# Patient Record
Sex: Male | Born: 1945 | Race: White | Hispanic: No | Marital: Single | Smoking: Current every day smoker
Health system: Southern US, Community
[De-identification: ages and names within clinical notes are randomized; demographics above are authoritative.]

## PROBLEM LIST (undated history)

## (undated) DIAGNOSIS — Z720 Tobacco use: Secondary | ICD-10-CM

## (undated) DIAGNOSIS — J449 Chronic obstructive pulmonary disease, unspecified: Secondary | ICD-10-CM

## (undated) DIAGNOSIS — K219 Gastro-esophageal reflux disease without esophagitis: Secondary | ICD-10-CM

## (undated) DIAGNOSIS — E785 Hyperlipidemia, unspecified: Secondary | ICD-10-CM

## (undated) DIAGNOSIS — N400 Enlarged prostate without lower urinary tract symptoms: Secondary | ICD-10-CM

## (undated) DIAGNOSIS — I4891 Unspecified atrial fibrillation: Secondary | ICD-10-CM

## (undated) DIAGNOSIS — I739 Peripheral vascular disease, unspecified: Secondary | ICD-10-CM

## (undated) DIAGNOSIS — G894 Chronic pain syndrome: Secondary | ICD-10-CM

## (undated) DIAGNOSIS — F4310 Post-traumatic stress disorder, unspecified: Secondary | ICD-10-CM

## (undated) DIAGNOSIS — Z89519 Acquired absence of unspecified leg below knee: Secondary | ICD-10-CM

## (undated) HISTORY — PX: BELOW KNEE LEG AMPUTATION: SUR23

## (undated) HISTORY — PX: OTHER SURGICAL HISTORY: SHX169

---

## 2011-10-15 ENCOUNTER — Other Ambulatory Visit: Payer: Self-pay

## 2011-10-15 ENCOUNTER — Emergency Department (HOSPITAL_COMMUNITY): Payer: Medicare Other

## 2011-10-15 ENCOUNTER — Encounter (HOSPITAL_COMMUNITY): Payer: Self-pay | Admitting: Emergency Medicine

## 2011-10-15 ENCOUNTER — Inpatient Hospital Stay (HOSPITAL_COMMUNITY)
Admission: EM | Admit: 2011-10-15 | Discharge: 2011-10-16 | DRG: 917 | Discharge status: Against Medical Advice | Payer: Medicare Other | Attending: Internal Medicine | Admitting: Internal Medicine

## 2011-10-15 DIAGNOSIS — K219 Gastro-esophageal reflux disease without esophagitis: Secondary | ICD-10-CM | POA: Diagnosis present

## 2011-10-15 DIAGNOSIS — G9340 Encephalopathy, unspecified: Secondary | ICD-10-CM | POA: Diagnosis present

## 2011-10-15 DIAGNOSIS — J189 Pneumonia, unspecified organism: Secondary | ICD-10-CM | POA: Diagnosis present

## 2011-10-15 DIAGNOSIS — I959 Hypotension, unspecified: Secondary | ICD-10-CM | POA: Diagnosis present

## 2011-10-15 DIAGNOSIS — R4182 Altered mental status, unspecified: Secondary | ICD-10-CM

## 2011-10-15 DIAGNOSIS — Z888 Allergy status to other drugs, medicaments and biological substances status: Secondary | ICD-10-CM

## 2011-10-15 DIAGNOSIS — F4312 Post-traumatic stress disorder, chronic: Secondary | ICD-10-CM | POA: Diagnosis present

## 2011-10-15 DIAGNOSIS — F19921 Other psychoactive substance use, unspecified with intoxication with delirium: Secondary | ICD-10-CM | POA: Diagnosis present

## 2011-10-15 DIAGNOSIS — F1320 Sedative, hypnotic or anxiolytic dependence, uncomplicated: Secondary | ICD-10-CM | POA: Diagnosis present

## 2011-10-15 DIAGNOSIS — I4891 Unspecified atrial fibrillation: Secondary | ICD-10-CM | POA: Diagnosis present

## 2011-10-15 DIAGNOSIS — Z886 Allergy status to analgesic agent status: Secondary | ICD-10-CM

## 2011-10-15 DIAGNOSIS — E876 Hypokalemia: Secondary | ICD-10-CM | POA: Diagnosis present

## 2011-10-15 DIAGNOSIS — E785 Hyperlipidemia, unspecified: Secondary | ICD-10-CM | POA: Diagnosis present

## 2011-10-15 DIAGNOSIS — R001 Bradycardia, unspecified: Secondary | ICD-10-CM | POA: Diagnosis present

## 2011-10-15 DIAGNOSIS — J438 Other emphysema: Secondary | ICD-10-CM | POA: Diagnosis present

## 2011-10-15 DIAGNOSIS — R031 Nonspecific low blood-pressure reading: Secondary | ICD-10-CM | POA: Diagnosis present

## 2011-10-15 DIAGNOSIS — Z720 Tobacco use: Secondary | ICD-10-CM

## 2011-10-15 DIAGNOSIS — Z79899 Other long term (current) drug therapy: Secondary | ICD-10-CM

## 2011-10-15 DIAGNOSIS — F1120 Opioid dependence, uncomplicated: Secondary | ICD-10-CM | POA: Diagnosis present

## 2011-10-15 DIAGNOSIS — G894 Chronic pain syndrome: Secondary | ICD-10-CM | POA: Diagnosis present

## 2011-10-15 DIAGNOSIS — F4310 Post-traumatic stress disorder, unspecified: Secondary | ICD-10-CM | POA: Diagnosis present

## 2011-10-15 DIAGNOSIS — T424X1A Poisoning by benzodiazepines, accidental (unintentional), initial encounter: Secondary | ICD-10-CM | POA: Diagnosis present

## 2011-10-15 DIAGNOSIS — T424X4A Poisoning by benzodiazepines, undetermined, initial encounter: Principal | ICD-10-CM | POA: Diagnosis present

## 2011-10-15 DIAGNOSIS — G4700 Insomnia, unspecified: Secondary | ICD-10-CM | POA: Diagnosis present

## 2011-10-15 DIAGNOSIS — I739 Peripheral vascular disease, unspecified: Secondary | ICD-10-CM | POA: Diagnosis present

## 2011-10-15 DIAGNOSIS — E871 Hypo-osmolality and hyponatremia: Secondary | ICD-10-CM | POA: Diagnosis present

## 2011-10-15 DIAGNOSIS — T40601A Poisoning by unspecified narcotics, accidental (unintentional), initial encounter: Secondary | ICD-10-CM

## 2011-10-15 DIAGNOSIS — T400X1A Poisoning by opium, accidental (unintentional), initial encounter: Secondary | ICD-10-CM | POA: Diagnosis present

## 2011-10-15 DIAGNOSIS — I498 Other specified cardiac arrhythmias: Secondary | ICD-10-CM | POA: Diagnosis present

## 2011-10-15 DIAGNOSIS — F4321 Adjustment disorder with depressed mood: Secondary | ICD-10-CM | POA: Diagnosis present

## 2011-10-15 DIAGNOSIS — F17200 Nicotine dependence, unspecified, uncomplicated: Secondary | ICD-10-CM | POA: Diagnosis present

## 2011-10-15 DIAGNOSIS — D649 Anemia, unspecified: Secondary | ICD-10-CM | POA: Diagnosis present

## 2011-10-15 DIAGNOSIS — R5383 Other fatigue: Secondary | ICD-10-CM | POA: Diagnosis present

## 2011-10-15 DIAGNOSIS — S88119A Complete traumatic amputation at level between knee and ankle, unspecified lower leg, initial encounter: Secondary | ICD-10-CM

## 2011-10-15 DIAGNOSIS — N400 Enlarged prostate without lower urinary tract symptoms: Secondary | ICD-10-CM | POA: Diagnosis present

## 2011-10-15 HISTORY — DX: Acquired absence of unspecified leg below knee: Z89.519

## 2011-10-15 HISTORY — DX: Chronic obstructive pulmonary disease, unspecified: J44.9

## 2011-10-15 HISTORY — DX: Chronic pain syndrome: G89.4

## 2011-10-15 HISTORY — DX: Tobacco use: Z72.0

## 2011-10-15 HISTORY — DX: Peripheral vascular disease, unspecified: I73.9

## 2011-10-15 HISTORY — DX: Hyperlipidemia, unspecified: E78.5

## 2011-10-15 HISTORY — DX: Unspecified atrial fibrillation: I48.91

## 2011-10-15 HISTORY — DX: Gastro-esophageal reflux disease without esophagitis: K21.9

## 2011-10-15 HISTORY — DX: Benign prostatic hyperplasia without lower urinary tract symptoms: N40.0

## 2011-10-15 HISTORY — DX: Post-traumatic stress disorder, unspecified: F43.10

## 2011-10-15 LAB — COMPREHENSIVE METABOLIC PANEL
AST: 22 U/L (ref 0–37)
Albumin: 3.1 g/dL — ABNORMAL LOW (ref 3.5–5.2)
Alkaline Phosphatase: 52 U/L (ref 39–117)
CO2: 31 mEq/L (ref 19–32)
Chloride: 96 mEq/L (ref 96–112)
GFR calc non Af Amer: >90 mL/min (ref >90)
Potassium: 3.4 mEq/L — ABNORMAL LOW (ref 3.5–5.1)
Total Bilirubin: 0.6 mg/dL (ref 0.3–1.2)

## 2011-10-15 LAB — ACETAMINOPHEN LEVEL: Acetaminophen (Tylenol), Serum: <15 ug/mL (ref 10–30)

## 2011-10-15 LAB — PROCALCITONIN: Procalcitonin: 0.21 ng/mL

## 2011-10-15 LAB — DIFFERENTIAL
Basophils Absolute: 0 10*3/uL (ref 0.0–0.1)
Basophils Relative: 0 % (ref 0–1)
Eosinophils Absolute: 0.3 10*3/uL (ref 0.0–0.7)
Eosinophils Relative: 3 % (ref 0–5)
Lymphs Abs: 2 10*3/uL (ref 0.7–4.0)

## 2011-10-15 LAB — URINALYSIS, ROUTINE W REFLEX MICROSCOPIC
Bilirubin Urine: NEGATIVE
Ketones, ur: NEGATIVE mg/dL
Nitrite: NEGATIVE
Protein, ur: NEGATIVE mg/dL
pH: 6 (ref 5.0–8.0)

## 2011-10-15 LAB — POCT I-STAT TROPONIN I: Troponin i, poc: 0 ng/mL (ref 0.00–0.08)

## 2011-10-15 LAB — LACTIC ACID, PLASMA: Lactic Acid, Venous: 1 mmol/L (ref 0.5–2.2)

## 2011-10-15 LAB — RAPID URINE DRUG SCREEN, HOSP PERFORMED
Amphetamines: NOT DETECTED
Opiates: POSITIVE — AB

## 2011-10-15 LAB — GLUCOSE, CAPILLARY: Glucose-Capillary: 130 mg/dL — ABNORMAL HIGH (ref 70–99)

## 2011-10-15 LAB — CBC
MCH: 32.3 pg (ref 26.0–34.0)
MCHC: 34.4 g/dL (ref 30.0–36.0)
MCV: 94 fL (ref 78.0–100.0)
Platelets: 200 10*3/uL (ref 150–400)
RDW: 13.9 % (ref 11.5–15.5)

## 2011-10-15 LAB — CARDIAC PANEL(CRET KIN+CKTOT+MB+TROPI): Relative Index: 1 (ref 0.0–2.5)

## 2011-10-15 MED ORDER — SODIUM CHLORIDE 0.9 % IV SOLN
INTRAVENOUS | Status: DC
  Administered 2011-10-15: 09:00:00 via INTRAVENOUS

## 2011-10-15 MED ORDER — TAMSULOSIN HCL 0.4 MG PO CAPS
0.4000 mg | ORAL_CAPSULE | Freq: Every day | ORAL | Status: DC
  Administered 2011-10-15: 0.4 mg via ORAL
  Filled 2011-10-15: qty 1

## 2011-10-15 MED ORDER — NALOXONE HCL 1 MG/ML IJ SOLN
2.0000 mg | Freq: Once | INTRAMUSCULAR | Status: AC
  Administered 2011-10-15: 2 mg via INTRAVENOUS

## 2011-10-15 MED ORDER — FINASTERIDE 5 MG PO TABS
5.0000 mg | ORAL_TABLET | Freq: Every day | ORAL | Status: DC
  Administered 2011-10-15 – 2011-10-16 (×2): 5 mg via ORAL
  Filled 2011-10-15 (×3): qty 1

## 2011-10-15 MED ORDER — MUPIROCIN 2 % EX OINT
1.0000 "application " | TOPICAL_OINTMENT | Freq: Two times a day (BID) | CUTANEOUS | Status: DC
  Administered 2011-10-15 – 2011-10-16 (×2): 1 via NASAL
  Filled 2011-10-15: qty 22

## 2011-10-15 MED ORDER — ACETAMINOPHEN 650 MG RE SUPP: 650.0000 mg | Freq: Four times a day (QID) | RECTAL | Status: DC | PRN

## 2011-10-15 MED ORDER — MOXIFLOXACIN HCL IN NACL 400 MG/250ML IV SOLN
400.0000 mg | Freq: Once | INTRAVENOUS | Status: AC
  Administered 2011-10-15: 400 mg via INTRAVENOUS
  Filled 2011-10-15: qty 250

## 2011-10-15 MED ORDER — NICOTINE 21 MG/24HR TD PT24
21.0000 mg | MEDICATED_PATCH | Freq: Every day | TRANSDERMAL | Status: DC
  Administered 2011-10-16: 21 mg via TRANSDERMAL
  Filled 2011-10-15: qty 1

## 2011-10-15 MED ORDER — NALOXONE HCL 0.4 MG/ML IJ SOLN
0.4000 mg | Freq: Once | INTRAMUSCULAR | Status: AC
  Administered 2011-10-15: 0.4 mg via INTRAVENOUS
  Filled 2011-10-15: qty 1

## 2011-10-15 MED ORDER — IOHEXOL 300 MG/ML  SOLN
80.0000 mL | Freq: Once | INTRAMUSCULAR | Status: AC | PRN
  Administered 2011-10-15: 80 mL via INTRAVENOUS

## 2011-10-15 MED ORDER — ONDANSETRON HCL 4 MG/2ML IJ SOLN: 4.0000 mg | Freq: Four times a day (QID) | INTRAMUSCULAR | Status: DC | PRN

## 2011-10-15 MED ORDER — MORPHINE SULFATE 2 MG/ML IJ SOLN
2.0000 mg | INTRAMUSCULAR | Status: DC | PRN
  Administered 2011-10-16: 2 mg via INTRAVENOUS
  Filled 2011-10-15: qty 1

## 2011-10-15 MED ORDER — THIAMINE HCL 100 MG/ML IJ SOLN
100.0000 mg | Freq: Every day | INTRAMUSCULAR | Status: AC
  Administered 2011-10-15: 100 mg via INTRAVENOUS
  Filled 2011-10-15: qty 2

## 2011-10-15 MED ORDER — DOCUSATE SODIUM 100 MG PO CAPS
100.0000 mg | ORAL_CAPSULE | Freq: Two times a day (BID) | ORAL | Status: DC
  Administered 2011-10-15 – 2011-10-16 (×3): 100 mg via ORAL
  Filled 2011-10-15 (×3): qty 1

## 2011-10-15 MED ORDER — CLOPIDOGREL BISULFATE 75 MG PO TABS
75.0000 mg | ORAL_TABLET | Freq: Every day | ORAL | Status: DC
  Administered 2011-10-15 – 2011-10-16 (×2): 75 mg via ORAL
  Filled 2011-10-15 (×2): qty 1

## 2011-10-15 MED ORDER — PANTOPRAZOLE SODIUM 40 MG PO TBEC
40.0000 mg | DELAYED_RELEASE_TABLET | Freq: Every day | ORAL | Status: DC
  Administered 2011-10-15 – 2011-10-16 (×2): 40 mg via ORAL
  Filled 2011-10-15 (×2): qty 1

## 2011-10-15 MED ORDER — NALOXONE HCL 0.4 MG/ML IJ SOLN
2.0000 ug/kg/h | INTRAVENOUS | Status: DC
  Filled 2011-10-15: qty 2.5

## 2011-10-15 MED ORDER — KCL IN DEXTROSE-NACL 40-5-0.9 MEQ/L-%-% IV SOLN
INTRAVENOUS | Status: DC
  Administered 2011-10-15: 15:00:00 via INTRAVENOUS
  Administered 2011-10-16: 100 mL via INTRAVENOUS
  Filled 2011-10-15 (×4): qty 1000

## 2011-10-15 MED ORDER — MOXIFLOXACIN HCL IN NACL 400 MG/250ML IV SOLN
400.0000 mg | INTRAVENOUS | Status: DC
  Administered 2011-10-16: 400 mg via INTRAVENOUS
  Filled 2011-10-15 (×2): qty 250

## 2011-10-15 MED ORDER — ALPRAZOLAM 0.25 MG PO TABS: 0.2500 mg | ORAL_TABLET | Freq: Every evening | ORAL | Status: DC | PRN

## 2011-10-15 MED ORDER — MOXIFLOXACIN HCL IN NACL 400 MG/250ML IV SOLN
400.0000 mg | INTRAVENOUS | Status: DC
  Filled 2011-10-15 (×2): qty 250

## 2011-10-15 MED ORDER — NALOXONE HCL 1 MG/ML IJ SOLN
INTRAMUSCULAR | Status: AC
  Filled 2011-10-15: qty 2

## 2011-10-15 MED ORDER — SODIUM CHLORIDE 0.9 % IV SOLN
Freq: Once | INTRAVENOUS | Status: AC
  Administered 2011-10-15: 20 mL/h via INTRAVENOUS

## 2011-10-15 MED ORDER — ONDANSETRON HCL 4 MG PO TABS: 4.0000 mg | ORAL_TABLET | Freq: Four times a day (QID) | ORAL | Status: DC | PRN

## 2011-10-15 MED ORDER — ALUM & MAG HYDROXIDE-SIMETH 200-200-20 MG/5ML PO SUSP: 30.0000 mL | Freq: Four times a day (QID) | ORAL | Status: DC | PRN

## 2011-10-15 MED ORDER — ACETAMINOPHEN 325 MG PO TABS: 650.0000 mg | ORAL_TABLET | Freq: Four times a day (QID) | ORAL | Status: DC | PRN

## 2011-10-15 MED ORDER — CHLORHEXIDINE GLUCONATE CLOTH 2 % EX PADS
6.0000 | MEDICATED_PAD | Freq: Every day | CUTANEOUS | Status: DC
  Administered 2011-10-16: 6 via TOPICAL

## 2011-10-15 MED ORDER — SODIUM CHLORIDE 0.9 % IV BOLUS (SEPSIS)
500.0000 mL | Freq: Once | INTRAVENOUS | Status: AC
  Administered 2011-10-15: 500 mL via INTRAVENOUS

## 2011-10-15 NOTE — ED Notes (Signed)
Pt cleaned of incontinent urine X2

## 2011-10-15 NOTE — ED Notes (Signed)
Pt reports taking a xanax last night to help him sleep. Pt states he hasn't slept in a long time.

## 2011-10-15 NOTE — ED Provider Notes (Signed)
History   This chart was scribed for Laray Anger, DO by Clarita Crane. The patient was seen in room APA18/[facility]18 and the patient's care was started at 7:59AM.   CSN: [id number]  Arrival date & time 10/15/11  0716   First MD Initiated Contact with Patient 10/15/11 0750      Chief Complaint  Patient presents with  . Fatigue  . Fall    The history is provided by the patient and the EMS personnel. History Limited By: lethargic, poor historian.   Patient seen at 7:59AM Curtis Shea is a 66 y.o. male seen at 7:59 AM who presents to the Emergency Department after being brought in by EMS for evaluation following 2 falls this morning.  Pt states he rolled out of bed and fell to the floor both times. Patient also notes experiencing generalized weakness/fatigue this morning but reports taking a Valium and "some of my oxycodone" last night to help him sleep because he has been unable to sleep for the past several days. States he used his "[employer]" to call for EMS.     PCP- None Psychiatrist- in [place], [place] Past Medical History  Diagnosis Date  . COPD (chronic obstructive pulmonary disease)   . PTSD (post-traumatic stress disorder)   . Hx of BKA     Past Surgical History  Procedure Date  . Below knee leg amputation     History  Substance Use Topics  . Smoking status: Current Everyday Smoker -- 1.0 packs/day  . Smokeless tobacco: Not on file  . Alcohol Use: No  -Lives alone    Review of Systems  Unable to perform ROS: Other    Allergies  Aspirin; Mellaril; and Tegretol  Home Medications  No current outpatient prescriptions on file.  BP 93/43  Pulse 58  Temp(Src) 98.8 F (37.1 C) (Oral)  Resp 20  Ht 5\' 8"  (1.727 m)  Wt 135 lb (61.236 kg)  BMI 20.53 kg/m2  SpO2 94%  Physical Exam Exam performed at 8:01AM Physical examination:  Nursing notes reviewed; Vital signs and O2 SAT reviewed;  Constitutional: Thin, disheveled, In no acute distress; Head:  Normocephalic,  atraumatic; Eyes: EOMI, PERRL, No scleral icterus; ENMT: Mouth and pharynx normal, Mucous membranes dry; Neck: Supple, Full range of motion, No lymphadenopathy; Cardiovascular: Regular rate and rhythm, No gallop; Respiratory: Breath sounds clear & equal bilaterally, No wheezes, Normal respiratory effort/excursion; Chest: Nontender, Movement normal; Abdomen: Soft, Nontender, Nondistended, Normal bowel sounds; Extremities: Pulses normal, No deformity, no tenderness, No edema, +left BKA; Neuro: Lethargic, awakens to name, no facial droop, speech clear. Major CN grossly intact. Moves all ext without apparent focal motor deficits; Skin: Color normal, Warm, Dry, no rash.    ED Course  Procedures   0815:  Pt endorses to me that he has taken "some valium" to sleep, as well as "maybe I took some of my oxycodone."  Will give IVF and IV narcan.    0900:  More awake after narcan.  SBP now 100's from 60's, improved.  Sats remain high 90's on R/A.  Told ED staff that his "son died last week."  While undressing pt, ED staff found a bottle of misc pills.  Will send to pharmacy to ascertain what they are.   0930:  Pharm gave ED RN list of names of multiple pills in the bottle, which includes xanax, valium, oxycodone, oxycodone IR, morphine, morphine IR.  Will give another dose of narcan IV and start narcan gtt, given good effect with 1st  narcan dose as well as concern regarding long acting narcotic formulations pt may have taken.  Will not give romazicon due to high likelihood of pt using benzos long term, causing withdrawal seizure.  Will add ASA and APAP levels.  CT chest with left pneumonia.  Will start IV avelox.  Doubt sepsis at this time, as lactic acid and procalcitonin both normal.  Pt maintaining his own airway, Sats continue high 90's.   1010:  T/C to [facility] Dr. Sherrie Mustache, case discussed, including:  HPI, pertinent PM/SHx, VS/PE, dx testing, ED course and treatment.  Agreeable to admit.  Requests to write temporary  orders, ICU bed.      MDM  MDM Reviewed: nursing note and vitals Interpretation: ECG, labs, x-ray and CT scan Total time providing critical care: 75-105 minutes. This excludes time spent performing separately reportable procedures and services.   CRITICAL CARE Performed by: Laray Anger Total critical care time: 90 Critical care time was exclusive of separately billable procedures and treating other patients. Critical care was necessary to treat or prevent imminent or life-threatening deterioration. Critical care was time spent personally by me on the following activities: development of treatment plan with patient and/or surrogate as well as nursing, discussions with consultants, evaluation of patient's response to treatment, examination of patient, obtaining history from patient or surrogate, ordering and performing treatments and interventions, ordering and review of laboratory studies, ordering and review of radiographic studies, pulse oximetry and re-evaluation of patient's condition.    Date: 10/15/2011  Rate: 57  Rhythm: normal sinus rhythm  QRS Axis: left  Intervals: normal  ST/T Wave abnormalities: normal  Conduction Disutrbances:none  Narrative Interpretation:   Old EKG Reviewed: none available.  Results for orders placed during the hospital encounter of 10/15/11  COMPREHENSIVE METABOLIC PANEL      Component Value Range   Sodium 134 (*) 135 - 145 (mEq/L)   Potassium 3.4 (*) 3.5 - 5.1 (mEq/L)   Chloride 96  96 - 112 (mEq/L)   CO2 31  19 - 32 (mEq/L)   Glucose, Bld 121 (*) 70 - 99 (mg/dL)   BUN 6  6 - 23 (mg/dL)   Creatinine, Ser 1.61  0.50 - 1.35 (mg/dL)   Calcium 9.2  8.4 - 09.6 (mg/dL)   Total Protein 6.2  6.0 - 8.3 (g/dL)   Albumin 3.1 (*) 3.5 - 5.2 (g/dL)   AST 22  0 - 37 (U/L)   ALT 13  0 - 53 (U/L)   Alkaline Phosphatase 52  39 - 117 (U/L)   Total Bilirubin 0.6  0.3 - 1.2 (mg/dL)   GFR calc non Af Amer >90  >90 (mL/min)   GFR calc Af Amer >90  >90  (mL/min)  CBC      Component Value Range   WBC 9.1  4.0 - 10.5 (K/uL)   RBC 3.68 (*) 4.22 - 5.81 (MIL/uL)   Hemoglobin 11.9 (*) 13.0 - 17.0 (g/dL)   HCT 04.5 (*) 40.9 - 52.0 (%)   MCV 94.0  78.0 - 100.0 (fL)   MCH 32.3  26.0 - 34.0 (pg)   MCHC 34.4  30.0 - 36.0 (g/dL)   RDW 81.1  91.4 - 78.2 (%)   Platelets 200  150 - 400 (K/uL)  DIFFERENTIAL      Component Value Range   Neutrophils Relative 69  43 - 77 (%)   Neutro Abs 6.2  1.7 - 7.7 (K/uL)   Lymphocytes Relative 22  12 - 46 (%)   Lymphs  Abs 2.0  0.7 - 4.0 (K/uL)   Monocytes Relative 6  3 - 12 (%)   Monocytes Absolute 0.5  0.1 - 1.0 (K/uL)   Eosinophils Relative 3  0 - 5 (%)   Eosinophils Absolute 0.3  0.0 - 0.7 (K/uL)   Basophils Relative 0  0 - 1 (%)   Basophils Absolute 0.0  0.0 - 0.1 (K/uL)  LACTIC ACID, PLASMA      Component Value Range   Lactic Acid, Venous 1.0  0.5 - 2.2 (mmol/L)  URINALYSIS, ROUTINE W REFLEX MICROSCOPIC      Component Value Range   Color, Urine YELLOW  YELLOW    APPearance CLEAR  CLEAR    Specific Gravity, Urine 1.005  1.005 - 1.030    pH 6.0  5.0 - 8.0    Glucose, UA NEGATIVE  NEGATIVE (mg/dL)   Hgb urine dipstick NEGATIVE  NEGATIVE    Bilirubin Urine NEGATIVE  NEGATIVE    Ketones, ur NEGATIVE  NEGATIVE (mg/dL)   Protein, ur NEGATIVE  NEGATIVE (mg/dL)   Urobilinogen, UA 0.2  0.0 - 1.0 (mg/dL)   Nitrite NEGATIVE  NEGATIVE    Leukocytes, UA NEGATIVE  NEGATIVE   URINE RAPID DRUG SCREEN (HOSP PERFORMED)      Component Value Range   Opiates POSITIVE (*) NONE DETECTED    Cocaine NONE DETECTED  NONE DETECTED    Benzodiazepines POSITIVE (*) NONE DETECTED    Amphetamines NONE DETECTED  NONE DETECTED    Tetrahydrocannabinol NONE DETECTED  NONE DETECTED    Barbiturates NONE DETECTED  NONE DETECTED   ETHANOL      Component Value Range   Alcohol, Ethyl (B) <11  0 - 11 (mg/dL)  PROCALCITONIN      Component Value Range   Procalcitonin 0.21    POCT I-STAT TROPONIN I      Component Value Range    Troponin i, poc 0.00  0.00 - 0.08 (ng/mL)   Comment 3           GLUCOSE, CAPILLARY      Component Value Range   Glucose-Capillary 130 (*) 70 - 99 (mg/dL)   Ct Head Wo Contrast 10/15/2011  *RADIOLOGY REPORT*  Clinical Data: Fall, unresponsive  CT HEAD WITHOUT CONTRAST  Technique:  Contiguous axial images were obtained from the base of the skull through the vertex without contrast.  Comparison: None.  Findings: The patient's head was tilted in the scanner on initial scan.  The scan was repeated with improvement.  No acute intracranial hemorrhage.  No focal mass lesion.  No CT evidence of acute infarction.   No midline shift or mass effect. No hydrocephalus.  Basilar cisterns are patent.  There is mild generalized cortical atrophy.  Mastoid air cells are clear.  There is mild mucosal thickening within the maxillary and sphenoid and frontal sinuses inferiorly.  IMPRESSION:  1.  No acute intracranial findings. 2.  Mild atrophy. 3.  Mild sinus inflammation.  Original Report Authenticated By: Genevive Bi, M.D.   Ct Chest W Contrast 10/15/2011  *RADIOLOGY REPORT*  Clinical Data: Pneumonia on chest radiograph., short of breath  CT CHEST WITH CONTRAST  Technique:  Multidetector CT imaging of the chest was performed following the standard protocol during bolus administration of intravenous contrast.  Contrast: 80mL OMNIPAQUE IOHEXOL 300 MG/ML IV SOLN  Comparison: Chest radiograph 10/15/2011  Findings: There is patchy nodular air space disease within the left lower lobe most consistent with pneumonia.  There is mild central lobular emphysema  at the lung apices.  Central airways are normal.  No axillary supraclavicular lymphadenopathy.  Mildly enlarge subcarinal lymph node measures 12 mm short axis.  No pericardial fluid.  Coronary artery disease noted.  Limited view of the upper abdomen shows normal adrenal glands. There is a surgical line in the gastric cardiac region.  Limited view of the skeleton demonstrates  posterior healed rib fractures versus thoracotomy defects in the left posterior ribs. There is compression deformity of the mid thoracic vertebral body. There are to large a Schmorl's nodes within this vertebral body.  IMPRESSION:  1.  Left lower lobe pneumonia.  Recommend follow-up CT  to insure resolution after appropriate therapy. The patient is a heavy smoker.  2.  Enlarge subcarinal lymph nodes likely reactive.  Recommend attention on follow-up CT. 3.  Central lobular emphysema. 4.  Mild chronic compression deformity of the mid lumbar vertebral body with large endplate Schmorl's nodes.  Original Report Authenticated By: Genevive Bi, M.D.   Dg Chest Port 1 View 10/15/2011  *RADIOLOGY REPORT*  Clinical Data: Shortness of breath, weakness  PORTABLE CHEST - 1 VIEW  Comparison: None.  Findings: There is opacity of both lung bases left greater than right most consistent with pneumonia.  The lungs are slightly hyperaerated.  No effusion is seen.  The heart is mildly enlarged. A cavitary lesion cannot be excluded overlying the left heart border at the medial left lung base.  Follow-up two-view chest x- ray and possible CT the chest is recommended.  IMPRESSION:  1.  Bibasilar lung opacities most consistent with pneumonia. 2.  Cannot exclude a cavitary lesion medially at the left lung base.  Consider CT of the chest with IV contrast to assess further.  Original Report Authenticated By: Juline Patch, M.D.          I personally performed the services described in this documentation, which was scribed in my presence. The recorded information has been reviewed and considered.  Vonya Ohalloran Allison Quarry, DO 10/16/11 1510

## 2011-10-15 NOTE — H&P (Signed)
Curtis Shea MRN: [id number] DOB/AGE: 1946/04/17 66 y.o. Primary Care Physician: [facility] [place]. Admit date: 10/15/2011 Chief Complaint: The patient fell out of bed twice. HPI: The patient is a 66 year old man apparently new to the [facility] health system, who presents to the emergency department this morning after falling out of his bed twice. The patient is lethargic and is able to provide some history. Therefore, the history is being provided by the patient and from the ED staff. Accordingly, the patient had been having difficulty sleeping because his son died unexpectedly last week. He does not outright admit to taking more of his chronic medications for pain and anxiety as prescribed. He denies suicidal ideation or any attempts to take his life or hurt himself. Apparently, he fell out of his bed twice this morning. He does not recall the circumstances surrounding the falls. He was able to brace himself to get back up to his bed. He was able to push his lifeline button. Subsequently, EMS was called. The patient denies any recent fever, chills, chest pain, shortness of breath, abdominal pain, nausea, vomiting, or diarrhea. He has acknowledges a wet nonproductive cough. On arrival to the emergency department, his pulse rate was 48 and his blood pressure was 69/41. He was afebrile. Currently, his blood pressure is in the 100s systolically and his pulse rate is 53. The CT scan of his head revealed no acute intracranial findings. His chest x-ray reveals bibasilar opacities most consistent with pneumonia. The CT scan of his chest reveals left lower lobe pneumonia, emphysema, and enlarged lymph nodes likely reactive. His urine drug screen is positive for benzodiazepines and opiates. His lab data are significant for a serum sodium of 134, serum potassium 3.4, glucose of 121, hemoglobin 11.9, and a troponin I of 0. He is being admitted for further evaluation and management.   Past Medical History  Diagnosis  Date  . COPD (chronic obstructive pulmonary disease)   . PTSD (post-traumatic stress disorder)   . Hx of BKA   . GERD (gastroesophageal reflux disease)   . Peripheral vascular disease   . Atrial fibrillation   . Chronic pain syndrome 10/15/2011  . Tobacco abuse 10/15/2011  . Hyperlipidemia   . BPH (benign prostatic hyperplasia)     Past Surgical History  Procedure Date  . Below knee leg amputation     Left leg.  . Multiple surgeries left foot     Prior to Admission medications   Medication Sig Start Date End Date Taking? Authorizing Provider  albuterol (PROVENTIL HFA;VENTOLIN HFA) 108 (90 BASE) MCG/ACT inhaler Inhale 2 puffs into the lungs every 6 (six) hours as needed. Shortness of breath   Yes Historical Provider, MD  ALPRAZolam Prudy Feeler) 1 MG tablet Take 1 mg by mouth at bedtime as needed. anxiety   Yes Historical Provider, MD  calcium carbonate (OS-CAL) 600 MG TABS Take 600 mg by mouth 2 (two) times daily with a meal.   Yes Historical Provider, MD  cholecalciferol (VITAMIN D) 400 UNITS TABS Take 400 Units by mouth daily.   Yes Historical Provider, MD  clopidogrel (PLAVIX) 75 MG tablet Take 75 mg by mouth daily.   Yes Historical Provider, MD  finasteride (PROSCAR) 5 MG tablet Take 5 mg by mouth daily.   Yes Historical Provider, MD  morphine (MS CONTIN) 100 MG 12 hr tablet Take 100 mg by mouth every 8 (eight) hours. Morphine sulfate ER 100 mg tablets   Yes Historical Provider, MD  morphine (MSIR) 30 MG  tablet Take 30 mg by mouth every 6 (six) hours as needed. pain   Yes Historical Provider, MD  omeprazole (PRILOSEC) 20 MG capsule Take 20 mg by mouth daily.   Yes Historical Provider, MD  oxycodone (OXY-IR) 5 MG capsule Take 10 mg by mouth every 6 (six) hours as needed. Use only if needed for pain control   Yes Historical Provider, MD  simvastatin (ZOCOR) 80 MG tablet Take 80 mg by mouth at bedtime.   Yes Historical Provider, MD  Tamsulosin HCl (FLOMAX) 0.4 MG CAPS Take 0.4 mg by mouth.    Yes Historical Provider, MD    Allergies:  Allergies  Allergen Reactions  . Aspirin   . Mellaril (Thioridazine Hcl)   . Tegretol (Carbamazepine)     History reviewed. No pertinent family history.  Social History: the patient is divorced. He lives in [place]. His son had lived with him, however, he died unexpectedly last week. He was 68 years old. The patient is a Cytogeneticist. He smokes one pack of cigarettes per day and has been doing so for over 40 years. He denies alcohol and illicit drug use.    ROS: Positive for chronic pain in both of his lower extremities. Otherwise, review of systems is negative.   PHYSICAL EXAM: Blood pressure 108/67, pulse 53, temperature 97.8 F (36.6 C), temperature source Oral, resp. rate 23, height 5\' 8"  (1.727 m), weight 59 kg (130 lb 1.1 oz), SpO2 100.00%.  General: Thin 66 year old Caucasian man who is asleep and mildly difficult to arouse. HEENT: Head is normocephalic, nontraumatic. Pupils equal, round, and reactive to light. Extraocular movements are intact. Conjunctivae are clear. Sclerae are white. Nasal mucosa is dry. Oropharynx reveals no teeth. His membranes are very dry. No posterior exudates or or erythema. Neck: Supple, no adenopathy, no thyromegaly, no JVD. Lungs: Occasional wheezes and crackles faintly. Breathing is nonlabored. Heart: S1, S2, with bradycardia. Abdomen: Positive bowel sounds, soft, nontender, nondistended. Extremities: Left BKA stump in place with no abrasions. Right lower extremity with palpable pedal pulses. One small excoriation on the medial aspect of the leg, non-bleeding. No pedal edema. Neurologic: He is lethargic. He answers with short answers. He falls back to sleep. He has overall lethargy with some dysarthria when speaking. All of his cranial nerves appear to be intact otherwise. He is able to move his extremities with and without provocation.   Basic Metabolic Panel:  Basename 10/15/11 0811  NA 134*  K 3.4*    CL 96  CO2 31  GLUCOSE 121*  BUN 6  CREATININE 0.60  CALCIUM 9.2  MG --  PHOS --   Liver Function Tests:  Basename 10/15/11 0811  AST 22  ALT 13  ALKPHOS 52  BILITOT 0.6  PROT 6.2  ALBUMIN 3.1*   No results found for this basename: LIPASE:2,AMYLASE:2 in the last 72 hours No results found for this basename: AMMONIA:2 in the last 72 hours CBC:  Basename 10/15/11 0811  WBC 9.1  NEUTROABS 6.2  HGB 11.9*  HCT 34.6*  MCV 94.0  PLT 200   Cardiac Enzymes: No results found for this basename: CKTOTAL:3,CKMB:3,CKMBINDEX:3,TROPONINI:3 in the last 72 hours BNP: No results found for this basename: PROBNP:3 in the last 72 hours D-Dimer: No results found for this basename: DDIMER:2 in the last 72 hours CBG:  Basename 10/15/11 0715  GLUCAP 130*   Hemoglobin A1C: No results found for this basename: HGBA1C in the last 72 hours Fasting Lipid Panel: No results found for this basename: CHOL,HDL,LDLCALC,TRIG,CHOLHDL,LDLDIRECT  in the last 72 hours Thyroid Function Tests: No results found for this basename: TSH,T4TOTAL,FREET4,T3FREE,THYROIDAB in the last 72 hours Anemia Panel: No results found for this basename: VITAMINB12,FOLATE,FERRITIN,TIBC,IRON,RETICCTPCT in the last 72 hours Coagulation: No results found for this basename: LABPROT:2,INR:2 in the last 72 hours Urine Drug Screen: Drugs of Abuse     Component Value Date/Time   LABOPIA POSITIVE* 10/15/2011 0841   COCAINSCRNUR NONE DETECTED 10/15/2011 0841   LABBENZ POSITIVE* 10/15/2011 0841   AMPHETMU NONE DETECTED 10/15/2011 0841   THCU NONE DETECTED 10/15/2011 0841   LABBARB NONE DETECTED 10/15/2011 0841    Alcohol Level:  Basename 10/15/11 0811  ETH <11   Urinalysis:  Basename 10/15/11 0841  COLORURINE YELLOW  LABSPEC 1.005  PHURINE 6.0  GLUCOSEU NEGATIVE  HGBUR NEGATIVE  BILIRUBINUR NEGATIVE  KETONESUR NEGATIVE  PROTEINUR NEGATIVE  UROBILINOGEN 0.2  NITRITE NEGATIVE  LEUKOCYTESUR NEGATIVE   Misc. Labs:   EKG:  Sinus bradycardia with a heart rate of 57 beats per minute.   No results found for this or any previous visit (from the past 240 hour(s)).   Results for orders placed during the hospital encounter of 10/15/11 (from the past 48 hour(s))  GLUCOSE, CAPILLARY     Status: Abnormal   Collection Time   10/15/11  7:15 AM      Component Value Range Comment   Glucose-Capillary 130 (*) 70 - 99 (mg/dL)   COMPREHENSIVE METABOLIC PANEL     Status: Abnormal   Collection Time   10/15/11  8:11 AM      Component Value Range Comment   Sodium 134 (*) 135 - 145 (mEq/L)    Potassium 3.4 (*) 3.5 - 5.1 (mEq/L)    Chloride 96  96 - 112 (mEq/L)    CO2 31  19 - 32 (mEq/L)    Glucose, Bld 121 (*) 70 - 99 (mg/dL)    BUN 6  6 - 23 (mg/dL)    Creatinine, Ser 4.09  0.50 - 1.35 (mg/dL)    Calcium 9.2  8.4 - 10.5 (mg/dL)    Total Protein 6.2  6.0 - 8.3 (g/dL)    Albumin 3.1 (*) 3.5 - 5.2 (g/dL)    AST 22  0 - 37 (U/L)    ALT 13  0 - 53 (U/L)    Alkaline Phosphatase 52  39 - 117 (U/L)    Total Bilirubin 0.6  0.3 - 1.2 (mg/dL)    GFR calc non Af Amer >90  >90 (mL/min)    GFR calc Af Amer >90  >90 (mL/min)   CBC     Status: Abnormal   Collection Time   10/15/11  8:11 AM      Component Value Range Comment   WBC 9.1  4.0 - 10.5 (K/uL)    RBC 3.68 (*) 4.22 - 5.81 (MIL/uL)    Hemoglobin 11.9 (*) 13.0 - 17.0 (g/dL)    HCT 81.1 (*) 91.4 - 52.0 (%)    MCV 94.0  78.0 - 100.0 (fL)    MCH 32.3  26.0 - 34.0 (pg)    MCHC 34.4  30.0 - 36.0 (g/dL)    RDW 78.2  95.6 - 21.3 (%)    Platelets 200  150 - 400 (K/uL)   DIFFERENTIAL     Status: Normal   Collection Time   10/15/11  8:11 AM      Component Value Range Comment   Neutrophils Relative 69  43 - 77 (%)    Neutro Abs 6.2  1.7 - 7.7 (K/uL)    Lymphocytes Relative 22  12 - 46 (%)    Lymphs Abs 2.0  0.7 - 4.0 (K/uL)    Monocytes Relative 6  3 - 12 (%)    Monocytes Absolute 0.5  0.1 - 1.0 (K/uL)    Eosinophils Relative 3  0 - 5 (%)    Eosinophils Absolute 0.3  0.0 - 0.7  (K/uL)    Basophils Relative 0  0 - 1 (%)    Basophils Absolute 0.0  0.0 - 0.1 (K/uL)   ETHANOL     Status: Normal   Collection Time   10/15/11  8:11 AM      Component Value Range Comment   Alcohol, Ethyl (B) <11  0 - 11 (mg/dL)   URINALYSIS, ROUTINE W REFLEX MICROSCOPIC     Status: Normal   Collection Time   10/15/11  8:41 AM      Component Value Range Comment   Color, Urine YELLOW  YELLOW     APPearance CLEAR  CLEAR     Specific Gravity, Urine 1.005  1.005 - 1.030     pH 6.0  5.0 - 8.0     Glucose, UA NEGATIVE  NEGATIVE (mg/dL)    Hgb urine dipstick NEGATIVE  NEGATIVE     Bilirubin Urine NEGATIVE  NEGATIVE     Ketones, ur NEGATIVE  NEGATIVE (mg/dL)    Protein, ur NEGATIVE  NEGATIVE (mg/dL)    Urobilinogen, UA 0.2  0.0 - 1.0 (mg/dL)    Nitrite NEGATIVE  NEGATIVE     Leukocytes, UA NEGATIVE  NEGATIVE  MICROSCOPIC NOT DONE ON URINES WITH NEGATIVE PROTEIN, BLOOD, LEUKOCYTES, NITRITE, OR GLUCOSE <1000 mg/dL.  URINE RAPID DRUG SCREEN (HOSP PERFORMED)     Status: Abnormal   Collection Time   10/15/11  8:41 AM      Component Value Range Comment   Opiates POSITIVE (*) NONE DETECTED     Cocaine NONE DETECTED  NONE DETECTED     Benzodiazepines POSITIVE (*) NONE DETECTED     Amphetamines NONE DETECTED  NONE DETECTED     Tetrahydrocannabinol NONE DETECTED  NONE DETECTED     Barbiturates NONE DETECTED  NONE DETECTED    LACTIC ACID, PLASMA     Status: Normal   Collection Time   10/15/11  8:46 AM      Component Value Range Comment   Lactic Acid, Venous 1.0  0.5 - 2.2 (mmol/L)   PROCALCITONIN     Status: Normal   Collection Time   10/15/11  8:46 AM      Component Value Range Comment   Procalcitonin 0.21     POCT I-STAT TROPONIN I     Status: Normal   Collection Time   10/15/11  8:55 AM      Component Value Range Comment   Troponin i, poc 0.00  0.00 - 0.08 (ng/mL)    Comment 3            ACETAMINOPHEN LEVEL     Status: Normal   Collection Time   10/15/11  9:49 AM      Component Value Range  Comment   Acetaminophen (Tylenol), Serum <15.0  10 - 30 (ug/mL)   SALICYLATE LEVEL     Status: Abnormal   Collection Time   10/15/11  9:49 AM      Component Value Range Comment   Salicylate Lvl <2.0 (*) 2.8 - 20.0 (mg/dL)     Ct Head Wo Contrast  10/15/2011  *RADIOLOGY REPORT*  Clinical Data: Fall, unresponsive  CT HEAD WITHOUT CONTRAST  Technique:  Contiguous axial images were obtained from the base of the skull through the vertex without contrast.  Comparison: None.  Findings: The patient's head was tilted in the scanner on initial scan.  The scan was repeated with improvement.  No acute intracranial hemorrhage.  No focal mass lesion.  No CT evidence of acute infarction.   No midline shift or mass effect. No hydrocephalus.  Basilar cisterns are patent.  There is mild generalized cortical atrophy.  Mastoid air cells are clear.  There is mild mucosal thickening within the maxillary and sphenoid and frontal sinuses inferiorly.  IMPRESSION:  1.  No acute intracranial findings. 2.  Mild atrophy. 3.  Mild sinus inflammation.  Original Report Authenticated By: Genevive Bi, M.D.   Ct Chest W Contrast  10/15/2011  *RADIOLOGY REPORT*  Clinical Data: Pneumonia on chest radiograph., short of breath  CT CHEST WITH CONTRAST  Technique:  Multidetector CT imaging of the chest was performed following the standard protocol during bolus administration of intravenous contrast.  Contrast: 80mL OMNIPAQUE IOHEXOL 300 MG/ML IV SOLN  Comparison: Chest radiograph 10/15/2011  Findings: There is patchy nodular air space disease within the left lower lobe most consistent with pneumonia.  There is mild central lobular emphysema at the lung apices.  Central airways are normal.  No axillary supraclavicular lymphadenopathy.  Mildly enlarge subcarinal lymph node measures 12 mm short axis.  No pericardial fluid.  Coronary artery disease noted.  Limited view of the upper abdomen shows normal adrenal glands. There is a surgical line in  the gastric cardiac region.  Limited view of the skeleton demonstrates posterior healed rib fractures versus thoracotomy defects in the left posterior ribs. There is compression deformity of the mid thoracic vertebral body. There are to large a Schmorl's nodes within this vertebral body.  IMPRESSION:  1.  Left lower lobe pneumonia.  Recommend follow-up CT  to insure resolution after appropriate therapy. The patient is a heavy smoker.  2.  Enlarge subcarinal lymph nodes likely reactive.  Recommend attention on follow-up CT. 3.  Central lobular emphysema. 4.  Mild chronic compression deformity of the mid lumbar vertebral body with large endplate Schmorl's nodes.  Original Report Authenticated By: Genevive Bi, M.D.   Dg Chest Port 1 View  10/15/2011  *RADIOLOGY REPORT*  Clinical Data: Shortness of breath, weakness  PORTABLE CHEST - 1 VIEW  Comparison: None.  Findings: There is opacity of both lung bases left greater than right most consistent with pneumonia.  The lungs are slightly hyperaerated.  No effusion is seen.  The heart is mildly enlarged. A cavitary lesion cannot be excluded overlying the left heart border at the medial left lung base.  Follow-up two-view chest x- ray and possible CT the chest is recommended.  IMPRESSION:  1.  Bibasilar lung opacities most consistent with pneumonia. 2.  Cannot exclude a cavitary lesion medially at the left lung base.  Consider CT of the chest with IV contrast to assess further.  Original Report Authenticated By: Juline Patch, M.D.    Impression:  Principal Problem:  *Delirium, drug-induced Active Problems:  Encephalopathy  Lethargy  Bradycardia  Hypotension  Hypokalemia  Anemia  Chronic pain syndrome  Hyponatremia  Tobacco abuse  Pneumonia  Chronic posttraumatic stress syndrome  1. Delirium/encephalopathy, likely secondary to drug toxicity or an unintentional drug overdose with benzodiazepines and opiates.  2. Hypotension secondary to drug toxicity  and dehydration/hypovolemia.  3. Community-acquired pneumonia.  4. Hyponatremia secondary to dehydration/volume depletion.  5. Hypokalemia.  6. Bradycardia and with a history of paroxysmal atrial fibrillation. The bradycardia could be secondary to high doses of opiates. Thyroid dysfunction will need to be ruled out.  7. Tobacco abuse.  8. Chronic posttraumatic stress syndrome. He is treated with Xanax chronically.  9. Chronic pain syndrome. He is treated with large doses of oral morphine.  10. Mild anemia.   Plan:  The patient was given Narcan by the emergency department physician. She also ordered a Narcan drip. This will be discontinued as the patient is protecting his airway.  We'll start Avelox for treatment of pneumonia.  Will start IV fluid hydration.  We'll place a nicotine patch. We'll order tobacco cessation counseling.  Will keep the patient n.p.o. until he is more alert.  We'll treat his pain with small doses of IV morphine as needed. Will hold benzodiazepines. We'll be cognizant of restarting both oral morphine and Xanax to avoid withdrawal syndrome.  Spiritual counseling.  For further evaluation, we'll order cardiac enzymes, ferritin,  TSH, free T4, and vitamin B12.       Susa Bones 10/15/2011, 12:35 PM

## 2011-10-15 NOTE — ED Notes (Signed)
Patient BP is down RN Raynelle Fanning notified.

## 2011-10-15 NOTE — ED Notes (Signed)
Called to give report. Nurse unavailable at this time. 

## 2011-10-15 NOTE — ED Notes (Signed)
Per EMS pt called EMS for falling out of bed twice this am. Pt complaining of sternal pain and right leg from falling. Pt drowsy but answering questions appropriately.

## 2011-10-15 NOTE — ED Notes (Signed)
Pt given narcan 2mg  without change to alertness.EDP aware. Pt cleaned of incontinent urine at this time with linen changed to bed.

## 2011-10-16 DIAGNOSIS — G4700 Insomnia, unspecified: Secondary | ICD-10-CM | POA: Diagnosis present

## 2011-10-16 DIAGNOSIS — F4321 Adjustment disorder with depressed mood: Secondary | ICD-10-CM | POA: Diagnosis present

## 2011-10-16 LAB — CARDIAC PANEL(CRET KIN+CKTOT+MB+TROPI)
Relative Index: 1.7 (ref 0.0–2.5)
Total CK: 126 U/L (ref 7–232)
Troponin I: <0.3 ng/mL (ref <0.30)

## 2011-10-16 LAB — CBC
MCH: 31.8 pg (ref 26.0–34.0)
Platelets: 193 10*3/uL (ref 150–400)
RBC: 3.9 MIL/uL — ABNORMAL LOW (ref 4.22–5.81)
WBC: 4.5 10*3/uL (ref 4.0–10.5)

## 2011-10-16 LAB — COMPREHENSIVE METABOLIC PANEL
AST: 16 U/L (ref 0–37)
BUN: 6 mg/dL (ref 6–23)
CO2: 25 mEq/L (ref 19–32)
Chloride: 109 mEq/L (ref 96–112)
Creatinine, Ser: 0.53 mg/dL (ref 0.50–1.35)
GFR calc non Af Amer: >90 mL/min (ref >90)
Total Bilirubin: 0.5 mg/dL (ref 0.3–1.2)

## 2011-10-16 LAB — URINE CULTURE: Culture  Setup Time: 201302061403

## 2011-10-16 MED ORDER — BENZONATATE 100 MG PO CAPS: 100.0000 mg | ORAL_CAPSULE | Freq: Three times a day (TID) | ORAL | Status: DC

## 2011-10-16 MED ORDER — MORPHINE SULFATE CR 15 MG PO TB12
30.0000 mg | ORAL_TABLET | Freq: Three times a day (TID) | ORAL | Status: DC
  Administered 2011-10-16: 30 mg via ORAL
  Filled 2011-10-16: qty 2

## 2011-10-16 MED ORDER — DIAZEPAM 5 MG PO TABS
5.0000 mg | ORAL_TABLET | Freq: Three times a day (TID) | ORAL | Status: DC
  Administered 2011-10-16: 5 mg via ORAL
  Filled 2011-10-16: qty 1

## 2011-10-16 MED ORDER — HYDROCOD POLST-CHLORPHEN POLST 10-8 MG/5ML PO LQCR: 5.0000 mL | Freq: Two times a day (BID) | ORAL | Status: DC | PRN

## 2011-10-16 NOTE — Progress Notes (Signed)
Brought grief support materials for patient.  Listening presence and addressing his acute grief.  He stated he wanted to leave the hospital and asked me to share with his doctor which I did.  I also asked the patient to consider his need for care here at least for another day.  We talked about his feelings of anger, his support and also prayed with him.  He said he think about his need to be in the hospital for care even in the midst of his angst about being here.  He stated he appreciated the grief materials and would take time to read them later.

## 2011-10-16 NOTE — Progress Notes (Signed)
Subjective: The patient states that he was able to sleep last night despite being in the ICU. He has no complaints of abdominal pain, nausea, vomiting, or pleurisy.  Objective: Vital signs in last 24 hours: Filed Vitals:   10/16/11 0300 10/16/11 0400 10/16/11 0500 10/16/11 0600  BP: 107/57 105/57 101/51 113/66  Pulse: 49 52 51 63  Temp:  98.2 F (36.8 C)    TempSrc:  Oral    Resp: 16 18 17 20   Height:      Weight:      SpO2: 93% 92% 93% 96%    Intake/Output Summary (Last 24 hours) at 10/16/11 0846 Last data filed at 10/16/11 0600  Gross per 24 hour  Intake 2078.33 ml  Output   1251 ml  Net 827.33 ml    Weight change:  Physical exam: General: The patient is much more alert. He is in no acute distress. Lungs: Occasional crackles and wheezes in the bases. Breathing is nonlabored. Heart: S1, S2, with a soft systolic murmur. Abdomen: Positive bowel sounds, soft, nontender, nondistended. Extremities: Left lower extremity stump intact. No pedal edema on the right lower extremity. Neurologic/psychological: He is alert and oriented x2. Cranial nerves II through XII are intact. His affect is flat. He doesn't knowledge is some depression from his son dying last week. Denies suicidal ideation.  Lab Results: Basic Metabolic Panel:  Basename 10/16/11 0546 10/15/11 0811  NA 140 134*  K 3.8 3.4*  CL 109 96  CO2 25 31  GLUCOSE 137* 121*  BUN 6 6  CREATININE 0.53 0.60  CALCIUM 8.7 9.2  MG -- --  PHOS -- --   Liver Function Tests:  Basename 10/16/11 0546 10/15/11 0811  AST 16 22  ALT 10 13  ALKPHOS 48 52  BILITOT 0.5 0.6  PROT 5.7* 6.2  ALBUMIN 2.7* 3.1*   No results found for this basename: LIPASE:2,AMYLASE:2 in the last 72 hours No results found for this basename: AMMONIA:2 in the last 72 hours CBC:  Basename 10/16/11 0546 10/15/11 0811  WBC 4.5 9.1  NEUTROABS -- 6.2  HGB 12.4* 11.9*  HCT 36.6* 34.6*  MCV 93.8 94.0  PLT 193 200   Cardiac Enzymes:  Basename  10/15/11 2358 10/15/11 1302  CKTOTAL 126 222  CKMB 2.1 2.3  CKMBINDEX -- --  TROPONINI <0.30 <0.30   BNP: No results found for this basename: PROBNP:3 in the last 72 hours D-Dimer: No results found for this basename: DDIMER:2 in the last 72 hours CBG:  Basename 10/15/11 0715  GLUCAP 130*   Hemoglobin A1C: No results found for this basename: HGBA1C in the last 72 hours Fasting Lipid Panel: No results found for this basename: CHOL,HDL,LDLCALC,TRIG,CHOLHDL,LDLDIRECT in the last 72 hours Thyroid Function Tests:  [facility] 10/15/11 0811  TSH 0.394  T4TOTAL --  FREET4 0.94  T3FREE --  THYROIDAB --   Anemia Panel:  Basename 10/15/11 0811  VITAMINB12 573  FOLATE --  FERRITIN 97  TIBC --  IRON --  RETICCTPCT --   Coagulation: No results found for this basename: LABPROT:2,INR:2 in the last 72 hours Urine Drug Screen: Drugs of Abuse     Component Value Date/Time   LABOPIA POSITIVE* 10/15/2011 0841   COCAINSCRNUR NONE DETECTED 10/15/2011 0841   LABBENZ POSITIVE* 10/15/2011 0841   AMPHETMU NONE DETECTED 10/15/2011 0841   THCU NONE DETECTED 10/15/2011 0841   LABBARB NONE DETECTED 10/15/2011 0841    Alcohol Level:  Basename 10/15/11 0811  ETH <11   Urinalysis:  Alvira Philips  10/15/11 0841  COLORURINE YELLOW  LABSPEC 1.005  PHURINE 6.0  GLUCOSEU NEGATIVE  HGBUR NEGATIVE  BILIRUBINUR NEGATIVE  KETONESUR NEGATIVE  PROTEINUR NEGATIVE  UROBILINOGEN 0.2  NITRITE NEGATIVE  LEUKOCYTESUR NEGATIVE   Misc. Labs: CK-MB 2.1, total CK 126, troponin I less than 0.30.  Micro: Recent Results (from the past 240 hour(s))  MRSA PCR SCREENING     Status: Abnormal   Collection Time   10/15/11 12:22 PM      Component Value Range Status Comment   MRSA by PCR POSITIVE (*) NEGATIVE  Final     Studies/Results: Ct Head Wo Contrast  10/15/2011  *RADIOLOGY REPORT*  Clinical Data: Fall, unresponsive  CT HEAD WITHOUT CONTRAST  Technique:  Contiguous axial images were obtained from the base of the  skull through the vertex without contrast.  Comparison: None.  Findings: The patient's head was tilted in the scanner on initial scan.  The scan was repeated with improvement.  No acute intracranial hemorrhage.  No focal mass lesion.  No CT evidence of acute infarction.   No midline shift or mass effect. No hydrocephalus.  Basilar cisterns are patent.  There is mild generalized cortical atrophy.  Mastoid air cells are clear.  There is mild mucosal thickening within the maxillary and sphenoid and frontal sinuses inferiorly.  IMPRESSION:  1.  No acute intracranial findings. 2.  Mild atrophy. 3.  Mild sinus inflammation.  Original Report Authenticated By: Genevive Bi, M.D.   Ct Chest W Contrast  10/15/2011  *RADIOLOGY REPORT*  Clinical Data: Pneumonia on chest radiograph., short of breath  CT CHEST WITH CONTRAST  Technique:  Multidetector CT imaging of the chest was performed following the standard protocol during bolus administration of intravenous contrast.  Contrast: 80mL OMNIPAQUE IOHEXOL 300 MG/ML IV SOLN  Comparison: Chest radiograph 10/15/2011  Findings: There is patchy nodular air space disease within the left lower lobe most consistent with pneumonia.  There is mild central lobular emphysema at the lung apices.  Central airways are normal.  No axillary supraclavicular lymphadenopathy.  Mildly enlarge subcarinal lymph node measures 12 mm short axis.  No pericardial fluid.  Coronary artery disease noted.  Limited view of the upper abdomen shows normal adrenal glands. There is a surgical line in the gastric cardiac region.  Limited view of the skeleton demonstrates posterior healed rib fractures versus thoracotomy defects in the left posterior ribs. There is compression deformity of the mid thoracic vertebral body. There are to large a Schmorl's nodes within this vertebral body.  IMPRESSION:  1.  Left lower lobe pneumonia.  Recommend follow-up CT  to insure resolution after appropriate therapy. The patient  is a heavy smoker.  2.  Enlarge subcarinal lymph nodes likely reactive.  Recommend attention on follow-up CT. 3.  Central lobular emphysema. 4.  Mild chronic compression deformity of the mid lumbar vertebral body with large endplate Schmorl's nodes.  Original Report Authenticated By: Genevive Bi, M.D.   Dg Chest Port 1 View  10/15/2011  *RADIOLOGY REPORT*  Clinical Data: Shortness of breath, weakness  PORTABLE CHEST - 1 VIEW  Comparison: None.  Findings: There is opacity of both lung bases left greater than right most consistent with pneumonia.  The lungs are slightly hyperaerated.  No effusion is seen.  The heart is mildly enlarged. A cavitary lesion cannot be excluded overlying the left heart border at the medial left lung base.  Follow-up two-view chest x- ray and possible CT the chest is recommended.  IMPRESSION:  1.  Bibasilar lung opacities most consistent with pneumonia. 2.  Cannot exclude a cavitary lesion medially at the left lung base.  Consider CT of the chest with IV contrast to assess further.  Original Report Authenticated By: Juline Patch, M.D.    Medications: I have reviewed the patient's current medications.  Assessment: Principal Problem:  *Delirium, drug-induced Active Problems:  Encephalopathy  Lethargy  Bradycardia  Hypotension  Hypokalemia  Anemia  Chronic pain syndrome  Hyponatremia  Tobacco abuse  Pneumonia  Chronic posttraumatic stress syndrome  Grief reaction  Insomnia  1. Delirium/encephalopathy, secondary to a combination of MS Contin, Valium, and Xanax. The patient denies suicidal attempt and this appears to be plausible. He denies taking more than prescribed, but I find this to be less than plausible. The patient has had insomnia likely secondary to grief and it is likely that he took more of his benzodiazepines than prescribed.  2. Bradycardia, resolved. He may have chronic bradycardia from large doses of MS Contin.  3. Hypotension and hyponatremia  secondary to dehydration/volume depletion. Both are resolving with IV fluid hydration.  4. Hypokalemia. Resolved with supplementation. Next  5. Posttraumatic stress disorder. He is on chronic benzodiazepines, both Xanax and Valium.  6. Pneumonia, community acquired. He is on Avelox. 7. Tobacco abuse. He was advised to stop smoking. Tobacco cessation counseling has been ordered.  8. Chronic pain syndrome. He is treated with large a doses of MS Contin by his physicians at the [place].   9. 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  The patient was admitted to the ICU bed because the ED physician ordered a Narcan drip. When the Narcan drip was not started or rather discontinued, the patient really did not meet criteria to be in the ICU. An order to transfer the patient to telemetry was inadvertently not done. His pneumonia diagnosis did not require him to be placed in the ICU. Therefore, he is being treated for community-acquired pneumonia without sepsis or SIRS. He will remain on IV Avelox for treatment of CAP. Blood cultures were not not warranted in this situation.     Plan:  1. We'll continue supportive treatment. We'll decrease the rate of IV fluids. His diet was advanced yesterday when he became more alert.   2. Will consult the chaplain for grief counseling. 3. Will restart Valium and MS Contin at much lower doses to avoid withdrawal syndrome. 4. Will consult the physical therapist for evaluation and management. 5. We'll transfer the patient to telemetry.   LOS: 1 day   Delcenia Inman 10/16/2011, 8:46 AM

## 2011-10-16 NOTE — Progress Notes (Signed)
Patient has signed out AMA Patient spoke with Dr Sherrie Mustache still wanted to go .  Paperwork signed patient waiting for ride

## 2011-10-16 NOTE — Evaluation (Signed)
Physical Therapy Evaluation Patient Details Name: Curtis Shea MRN: [id number] DOB: [birth year] Today's Date: 10/16/2011  Problem List:  Patient Active Problem List  Diagnoses  . Delirium, drug-induced  . Encephalopathy  . Lethargy  . Bradycardia  . Hypotension  . Hypokalemia  . Anemia  . Chronic pain syndrome  . Hyponatremia  . Tobacco abuse  . Pneumonia  . Chronic posttraumatic stress syndrome  . Grief reaction  . Insomnia    Past Medical History:  Past Medical History  Diagnosis Date  . COPD (chronic obstructive pulmonary disease)   . PTSD (post-traumatic stress disorder)   . Hx of BKA   . GERD (gastroesophageal reflux disease)   . Peripheral vascular disease   . Atrial fibrillation   . Chronic pain syndrome 10/15/2011  . Tobacco abuse 10/15/2011  . Hyperlipidemia   . BPH (benign prostatic hyperplasia)    Past Surgical History:  Past Surgical History  Procedure Date  . Below knee leg amputation     Left leg.  . Multiple surgeries left foot     PT Assessment/Plan/Recommendation PT Assessment Clinical Impression Statement: pt foound to have no mobility problems...no PT needed PT Recommendation/Assessment: Patent does not need any further PT services No Skilled PT: Patient at baseline level of functioning;Patient is independent with all acitivity/mobility PT Recommendation Follow Up Recommendations: No PT follow up Equipment Recommended: None recommended by PT PT Goals     PT Evaluation Precautions/Restrictions  Precautions Precaution Comments: contact precautions Required Braces or Orthoses: No Restrictions Weight Bearing Restrictions: No Prior Functioning  Home Living Lives With: Alone Receives Help From: Family Type of Home: House Home Layout: One level;Laundry or work area in [employer] Access: Ramped entrance [employer] Equipment: Straight cane Prior Function Level of Independence: Independent with basic ADLs;Independent with homemaking  with ambulation;Independent with gait;Independent with transfers;Requires assistive device for independence Driving: Yes Vocation: Retired Financial risk analyst Arousal/Alertness: Awake/alert Overall Cognitive Status: Appears within functional limits for tasks assessed Orientation Level: Oriented X4 Sensation/Coordination Sensation Light Touch: Appears Intact Stereognosis: Not tested Hot/Cold: Not tested Proprioception: Appears Intact Coordination Gross Motor Movements are Fluid and Coordinated: Yes Extremity Assessment RUE Assessment RUE Assessment: Within Functional Limits LUE Assessment LUE Assessment: Within Functional Limits RLE Assessment RLE Assessment: Within Functional Limits LLE Assessment LLE Assessment: Within Functional Limits (LBKA) Mobility (including Balance) Bed Mobility Bed Mobility: Yes Supine to Sit: 7: Independent Sit to Supine: 7: Independent Transfers Transfers: Yes Sit to Stand: 7: Independent Stand to Sit: 7: Independent Ambulation/Gait Ambulation/Gait: Yes Ambulation/Gait Assistance: 7: Independent Ambulation Distance (Feet): 300 Feet Assistive device: Straight cane Gait Pattern: Within Functional Limits Stairs: No Wheelchair Mobility Wheelchair Mobility: No  Posture/Postural Control Posture/Postural Control: No significant limitations Balance Balance Assessed:  (totally WNL) Exercise    End of Session PT - End of Session Equipment Utilized During Treatment: Gait belt Activity Tolerance: Patient tolerated treatment well Patient left: in chair Nurse Communication: Mobility status for transfers;Mobility status for ambulation General Behavior During Session: [facility] for tasks performed Cognition: [facility] for tasks performed  Konrad Penta 10/16/2011, 2:23 PM
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
## 2013-10-10 ENCOUNTER — Emergency Department (HOSPITAL_COMMUNITY)
Admission: EM | Admit: 2013-10-10 | Discharge: 2013-10-10 | Disposition: A | Discharge status: Home / Self Care | Payer: Non-veteran care | Attending: Emergency Medicine | Admitting: Emergency Medicine

## 2013-10-10 ENCOUNTER — Encounter (HOSPITAL_COMMUNITY): Payer: Self-pay | Admitting: Emergency Medicine

## 2013-10-10 ENCOUNTER — Emergency Department (HOSPITAL_COMMUNITY): Payer: Non-veteran care

## 2013-10-10 DIAGNOSIS — Z7902 Long term (current) use of antithrombotics/antiplatelets: Secondary | ICD-10-CM | POA: Insufficient documentation

## 2013-10-10 DIAGNOSIS — Y22XXXA Handgun discharge, undetermined intent, initial encounter: Secondary | ICD-10-CM | POA: Insufficient documentation

## 2013-10-10 DIAGNOSIS — F17200 Nicotine dependence, unspecified, uncomplicated: Secondary | ICD-10-CM | POA: Insufficient documentation

## 2013-10-10 DIAGNOSIS — J449 Chronic obstructive pulmonary disease, unspecified: Secondary | ICD-10-CM | POA: Insufficient documentation

## 2013-10-10 DIAGNOSIS — N400 Enlarged prostate without lower urinary tract symptoms: Secondary | ICD-10-CM | POA: Insufficient documentation

## 2013-10-10 DIAGNOSIS — Z8659 Personal history of other mental and behavioral disorders: Secondary | ICD-10-CM | POA: Insufficient documentation

## 2013-10-10 DIAGNOSIS — Z23 Encounter for immunization: Secondary | ICD-10-CM | POA: Insufficient documentation

## 2013-10-10 DIAGNOSIS — G894 Chronic pain syndrome: Secondary | ICD-10-CM | POA: Insufficient documentation

## 2013-10-10 DIAGNOSIS — Y939 Activity, unspecified: Secondary | ICD-10-CM | POA: Insufficient documentation

## 2013-10-10 DIAGNOSIS — K219 Gastro-esophageal reflux disease without esophagitis: Secondary | ICD-10-CM | POA: Insufficient documentation

## 2013-10-10 DIAGNOSIS — S301XXA Contusion of abdominal wall, initial encounter: Secondary | ICD-10-CM | POA: Insufficient documentation

## 2013-10-10 DIAGNOSIS — Z79899 Other long term (current) drug therapy: Secondary | ICD-10-CM | POA: Insufficient documentation

## 2013-10-10 DIAGNOSIS — Y92009 Unspecified place in unspecified non-institutional (private) residence as the place of occurrence of the external cause: Secondary | ICD-10-CM | POA: Insufficient documentation

## 2013-10-10 DIAGNOSIS — S71009A Unspecified open wound, unspecified hip, initial encounter: Secondary | ICD-10-CM | POA: Insufficient documentation

## 2013-10-10 DIAGNOSIS — S71109A Unspecified open wound, unspecified thigh, initial encounter: Principal | ICD-10-CM | POA: Insufficient documentation

## 2013-10-10 DIAGNOSIS — J4489 Other specified chronic obstructive pulmonary disease: Secondary | ICD-10-CM | POA: Insufficient documentation

## 2013-10-10 DIAGNOSIS — Z8679 Personal history of other diseases of the circulatory system: Secondary | ICD-10-CM | POA: Insufficient documentation

## 2013-10-10 DIAGNOSIS — E785 Hyperlipidemia, unspecified: Secondary | ICD-10-CM | POA: Insufficient documentation

## 2013-10-10 DIAGNOSIS — S88119A Complete traumatic amputation at level between knee and ankle, unspecified lower leg, initial encounter: Secondary | ICD-10-CM | POA: Insufficient documentation

## 2013-10-10 DIAGNOSIS — W3400XA Accidental discharge from unspecified firearms or gun, initial encounter: Secondary | ICD-10-CM

## 2013-10-10 LAB — CBC WITH DIFFERENTIAL/PLATELET
BASOS ABS: 0 10*3/uL (ref 0.0–0.1)
BASOS PCT: 0 % (ref 0–1)
EOS ABS: 0.6 10*3/uL (ref 0.0–0.7)
Eosinophils Relative: 7 % — ABNORMAL HIGH (ref 0–5)
HCT: 36.3 % — ABNORMAL LOW (ref 39.0–52.0)
Hemoglobin: 12.9 g/dL — ABNORMAL LOW (ref 13.0–17.0)
Lymphocytes Relative: 25 % (ref 12–46)
Lymphs Abs: 2 10*3/uL (ref 0.7–4.0)
MCH: 32 pg (ref 26.0–34.0)
MCHC: 35.5 g/dL (ref 30.0–36.0)
MCV: 90.1 fL (ref 78.0–100.0)
Monocytes Absolute: 0.8 10*3/uL (ref 0.1–1.0)
Monocytes Relative: 10 % (ref 3–12)
NEUTROS PCT: 59 % (ref 43–77)
Neutro Abs: 4.9 10*3/uL (ref 1.7–7.7)
PLATELETS: 200 10*3/uL (ref 150–400)
RBC: 4.03 MIL/uL — ABNORMAL LOW (ref 4.22–5.81)
RDW: 13.7 % (ref 11.5–15.5)
WBC: 8.2 10*3/uL (ref 4.0–10.5)

## 2013-10-10 LAB — SAMPLE TO BLOOD BANK

## 2013-10-10 LAB — BASIC METABOLIC PANEL
BUN: 8 mg/dL (ref 6–23)
CALCIUM: 8.3 mg/dL — AB (ref 8.4–10.5)
CHLORIDE: 93 meq/L — AB (ref 96–112)
CO2: 31 meq/L (ref 19–32)
CREATININE: 0.59 mg/dL (ref 0.50–1.35)
GFR calc Af Amer: >90 mL/min (ref >90)
GFR calc non Af Amer: >90 mL/min (ref >90)
GLUCOSE: 103 mg/dL — AB (ref 70–99)
Potassium: 3.5 mEq/L — ABNORMAL LOW (ref 3.7–5.3)
Sodium: 134 mEq/L — ABNORMAL LOW (ref 137–147)

## 2013-10-10 MED ORDER — SODIUM CHLORIDE 0.9 % IV BOLUS (SEPSIS)
1000.0000 mL | Freq: Once | INTRAVENOUS | Status: AC
  Administered 2013-10-10: 1000 mL via INTRAVENOUS

## 2013-10-10 MED ORDER — TETANUS-DIPHTH-ACELL PERTUSSIS 5-2.5-18.5 LF-MCG/0.5 IM SUSP
0.5000 mL | Freq: Once | INTRAMUSCULAR | Status: AC
  Administered 2013-10-10: 0.5 mL via INTRAMUSCULAR
  Filled 2013-10-10: qty 0.5

## 2013-10-10 MED ORDER — OXYCODONE HCL 5 MG PO TABS: 5.0000 mg | ORAL_TABLET | ORAL | Status: AC | PRN
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
## 2013-10-10 NOTE — ED Notes (Signed)
Pt states that he has a pain clinic appointment tomorrow.

## 2013-10-10 NOTE — ED Notes (Signed)
X-ray at bedside
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
## 2016-06-08 DEATH — deceased
